# Patient Record
Sex: Female | Born: 1980 | Race: Black or African American | Hispanic: No | Marital: Single | Smoking: Never smoker
Health system: Southern US, Community
[De-identification: ages and names within clinical notes are randomized; demographics above are authoritative.]

---

## 2011-12-06 ENCOUNTER — Emergency Department: Payer: Self-pay | Admitting: Emergency Medicine

## 2015-09-13 ENCOUNTER — Ambulatory Visit: Payer: Self-pay

## 2015-09-20 ENCOUNTER — Ambulatory Visit: Payer: Self-pay

## 2015-09-25 ENCOUNTER — Ambulatory Visit: Payer: Self-pay

## 2016-05-02 ENCOUNTER — Emergency Department: Payer: Self-pay

## 2016-05-02 ENCOUNTER — Emergency Department
Admission: EM | Admit: 2016-05-02 | Discharge: 2016-05-02 | Disposition: A | Discharge status: Home / Self Care | Payer: Self-pay | Attending: Emergency Medicine | Admitting: Emergency Medicine

## 2016-05-02 DIAGNOSIS — Y990 Civilian activity done for income or pay: Secondary | ICD-10-CM | POA: Insufficient documentation

## 2016-05-02 DIAGNOSIS — Y93F2 Activity, caregiving, lifting: Secondary | ICD-10-CM | POA: Insufficient documentation

## 2016-05-02 DIAGNOSIS — Y9289 Other specified places as the place of occurrence of the external cause: Secondary | ICD-10-CM | POA: Insufficient documentation

## 2016-05-02 DIAGNOSIS — X500XXA Overexertion from strenuous movement or load, initial encounter: Secondary | ICD-10-CM | POA: Insufficient documentation

## 2016-05-02 DIAGNOSIS — R0789 Other chest pain: Secondary | ICD-10-CM | POA: Insufficient documentation

## 2016-05-02 LAB — TROPONIN I: Troponin I: <0.03 ng/mL (ref <0.03)

## 2016-05-02 LAB — BASIC METABOLIC PANEL
Anion gap: 7 (ref 5–15)
BUN: 9 mg/dL (ref 6–20)
CHLORIDE: 104 mmol/L (ref 101–111)
CO2: 27 mmol/L (ref 22–32)
CREATININE: 0.71 mg/dL (ref 0.44–1.00)
Calcium: 9.3 mg/dL (ref 8.9–10.3)
Glucose, Bld: 107 mg/dL — ABNORMAL HIGH (ref 65–99)
POTASSIUM: 3.8 mmol/L (ref 3.5–5.1)
SODIUM: 138 mmol/L (ref 135–145)

## 2016-05-02 LAB — CBC
HCT: 37.2 % (ref 35.0–47.0)
Hemoglobin: 12 g/dL (ref 12.0–16.0)
MCH: 25.3 pg — ABNORMAL LOW (ref 26.0–34.0)
MCHC: 32.2 g/dL (ref 32.0–36.0)
MCV: 78.4 fL — AB (ref 80.0–100.0)
PLATELETS: 404 10*3/uL (ref 150–440)
RBC: 4.74 MIL/uL (ref 3.80–5.20)
RDW: 15.6 % — AB (ref 11.5–14.5)
WBC: 10.6 10*3/uL (ref 3.6–11.0)

## 2016-05-02 MED ORDER — IBUPROFEN 600 MG PO TABS
ORAL_TABLET | ORAL | Status: AC
  Administered 2016-05-02: 600 mg via ORAL
  Filled 2016-05-02: qty 1

## 2016-05-02 MED ORDER — IBUPROFEN 400 MG PO TABS
600.0000 mg | ORAL_TABLET | Freq: Once | ORAL | Status: AC
  Administered 2016-05-02: 600 mg via ORAL

## 2016-05-02 NOTE — ED Triage Notes (Signed)
Pt c/o worsening CP since Wednesday night, central chest and aching. Mild SOB associated per patient report. Pt alert and oriented X4, active, cooperative, pt in NAD. RR even and unlabored, color WNL.  Pt talks in full and complete sentences. Denies NV, diaphoresis.

## 2016-05-02 NOTE — ED Notes (Signed)
Pt in via triage with complaints of intermittent chest pain since yesterday.  Pt denies any associating symptoms, states pain is worse while lying.  Pt denies any recent injury.  Pt A/Ox4, ambulatory to room, no immediate distress at this time.

## 2016-05-02 NOTE — ED Provider Notes (Signed)
[facility] Emergency Department Provider Note  ____________________________________________   I have reviewed the triage vital signs and the nursing notes.   HISTORY  Chief Complaint Chest Pain    HPI Claudia Wells is a 35 y.o. female who is healthy, denies personal or family history of PE, DVT, CAD at a young age, she has no history of diabetes recent surgery she does not take any birth control pills. 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 This is unusual activity for her. She states she picked up some heavy and felt a pain in her chest. The pain is better as she put the item down however, it has persisted uninterrupted since that time.It is not pleuritic she has no shortness of breath or nausea or vomiting. It is not exertional. It is only worse when she touches it or changes position. She doesn't think the exact spot in the right pectoralis muscle above the right breast where it hurts. She believes that she is pulled a muscle but she wants to "be sure".     History reviewed. No pertinent past medical history.  There are no active problems to display for this patient.   History reviewed. No pertinent surgical history.  Prior to Admission medications   Not on File    Allergies Patient has no known allergies.  Family History  Problem Relation Age of Onset  . Diabetes Mother     Social History Social History  Substance Use Topics  . Smoking status: Never Smoker  . Smokeless tobacco: Never Used  . Alcohol use No    Review of Systems Constitutional: No fever/chills Eyes: No visual changes. ENT: No sore throat. No stiff neck no neck pain Cardiovascular: Positive chest pain. Respiratory: Denies shortness of breath. Gastrointestinal:   no vomiting.  No diarrhea.  No constipation. Genitourinary: Negative for dysuria. Musculoskeletal: Negative lower  extremity swelling Skin: Negative for rash. Neurological: Negative for severe headaches, focal weakness or numbness. 10-point ROS otherwise negative.  ____________________________________________   PHYSICAL EXAM:  VITAL SIGNS: ED Triage Vitals  Enc Vitals Group     BP 05/02/16 1640 119/88     Pulse Rate 05/02/16 1640 75     Resp 05/02/16 1640 18     Temp 05/02/16 1640 97.5 F (36.4 C)     Temp Source 05/02/16 1640 Oral     SpO2 05/02/16 1640 100 %     Weight 05/02/16 1640 240 lb (108.9 kg)     Height 05/02/16 1640 5\' 1"  (1.549 m)     Head Circumference --      Peak Flow --      Pain Score 05/02/16 1638 6     Pain Loc --      Pain Edu? --      Excl. in GC? --     Constitutional: Alert and oriented. Well appearing and in no acute distress. Eyes: Conjunctivae are normal. PERRL. EOMI. Head: Atraumatic. Nose: No congestion/rhinnorhea. Mouth/Throat: Mucous membranes are moist.  Oropharynx non-erythematous. Neck: No stridor.   Nontender with no meningismus Chest: Tender to palpation in the right chest wall at the insertion of the pectoralis muscle. Pneumatosis area patient says "ouch that's the pain right there". Patient has no erythema no shingles lesion no swelling. Female chaperone in the room with us. There is no evidence of cellulitis. Pain is also worse with changing position or lifting  up or using her right arm against resistance. No flail chest or crepitus  Cardiovascular: Normal rate, regular rhythm. Grossly normal heart sounds.  Good peripheral circulation. Respiratory: Normal respiratory effort.  No retractions. Lungs CTAB. Abdominal: Soft and nontender. No distention. No guarding no rebound Back:  There is no focal tenderness or step off.  there is no midline tenderness there are no lesions noted. there is no CVA tenderness  Musculoskeletal: No lower extremity tenderness, no upper extremity tenderness. No joint effusions, no DVT signs strong distal pulses no  edema Neurologic:  Normal speech and language. No gross focal neurologic deficits are appreciated.  Skin:  Skin is warm, dry and intact. No rash noted. Psychiatric: Mood and affect are normal. Speech and behavior are normal.  ____________________________________________   LABS (all labs ordered are listed, but only abnormal results are displayed)  Labs Reviewed  BASIC METABOLIC PANEL - Abnormal; Notable for the following:       Result Value   Glucose, Bld 107 (*)    All other components within normal limits  CBC - Abnormal; Notable for the following:    MCV 78.4 (*)    MCH 25.3 (*)    RDW 15.6 (*)    All other components within normal limits  TROPONIN I   ____________________________________________  EKG  I personally interpreted any EKGs ordered by me or triage Normal sinus rhythm at 73 beats minute no acute ST elevation or acute ST depression nonspecific ST changes, there is some baseline artifact noted. ____________________________________________  RADIOLOGY  I reviewed any imaging ordered by me or triage that were performed during my shift and, if possible, patient and/or family made aware of any abnormal findings. ____________________________________________   PROCEDURES  Procedure(s) performed: None  Procedures  Critical Care performed: None  ____________________________________________   INITIAL IMPRESSION / ASSESSMENT AND PLAN / ED COURSE  Pertinent labs & imaging results that were available during my care of the patient were reviewed by me and considered in my medical decision making (see chart for details).  Patient with very reproducible chest wall pain after lifting up something heavy. Obvious inciting injury. Troponin is negative despite chest discomfort for 3 days. EKG is reassuring. She is perked negative.  At this time, there does not appear to be clinical evidence to support the diagnosis of pulmonary embolus, dissection, myocarditis,  endocarditis, pericarditis, pericardial tamponade, acute coronary syndrome, pneumothorax, pneumonia, or any other acute intrathoracic pathology that will require admission or acute intervention. Nor is there evidence of any significant intra-abdominal pathology causing this discomfort.  Return precautions and follow-up given and understood.  Clinical Course    ____________________________________________   FINAL CLINICAL IMPRESSION(S) / ED DIAGNOSES  Final diagnoses:  None      This chart was dictated using voice recognition software.  Despite best efforts to proofread,  errors can occur which can change meaning.      Jeanmarie PlantJames A McShane, MD 05/02/16 [phone]

## 2017-08-23 ENCOUNTER — Encounter: Payer: Self-pay | Admitting: Medical Oncology

## 2017-08-23 ENCOUNTER — Emergency Department: Payer: Self-pay

## 2017-08-23 ENCOUNTER — Emergency Department
Admission: EM | Admit: 2017-08-23 | Discharge: 2017-08-23 | Disposition: A | Discharge status: Home / Self Care | Payer: Self-pay | Attending: Student in an Organized Health Care Education/Training Program | Admitting: Student in an Organized Health Care Education/Training Program

## 2017-08-23 DIAGNOSIS — R079 Chest pain, unspecified: Secondary | ICD-10-CM | POA: Insufficient documentation

## 2017-08-23 LAB — BASIC METABOLIC PANEL
Anion gap: 11 (ref 5–15)
BUN: 10 mg/dL (ref 6–20)
CHLORIDE: 102 mmol/L (ref 101–111)
CO2: 25 mmol/L (ref 22–32)
CREATININE: 0.71 mg/dL (ref 0.44–1.00)
Calcium: 9.5 mg/dL (ref 8.9–10.3)
GFR calc Af Amer: >60 mL/min (ref >60)
GFR calc non Af Amer: >60 mL/min (ref >60)
GLUCOSE: 103 mg/dL — AB (ref 65–99)
POTASSIUM: 3.9 mmol/L (ref 3.5–5.1)
Sodium: 138 mmol/L (ref 135–145)

## 2017-08-23 LAB — CBC
HEMATOCRIT: 38.4 % (ref 35.0–47.0)
Hemoglobin: 12.3 g/dL (ref 12.0–16.0)
MCH: 24.5 pg — ABNORMAL LOW (ref 26.0–34.0)
MCHC: 32 g/dL (ref 32.0–36.0)
MCV: 76.7 fL — AB (ref 80.0–100.0)
PLATELETS: 401 10*3/uL (ref 150–440)
RBC: 5.01 MIL/uL (ref 3.80–5.20)
RDW: 15.1 % — ABNORMAL HIGH (ref 11.5–14.5)
WBC: 13.8 10*3/uL — ABNORMAL HIGH (ref 3.6–11.0)

## 2017-08-23 LAB — TROPONIN I: Troponin I: <0.03 ng/mL (ref <0.03)

## 2017-08-23 MED ORDER — ALBUTEROL SULFATE HFA 108 (90 BASE) MCG/ACT IN AERS: 2.0000 | INHALATION_SPRAY | Freq: Four times a day (QID) | RESPIRATORY_TRACT | 2 refills | Status: AC | PRN

## 2017-08-23 MED ORDER — HYDROCODONE-ACETAMINOPHEN 5-325 MG PO TABS
1.0000 | ORAL_TABLET | Freq: Once | ORAL | Status: AC
  Administered 2017-08-23: 1 via ORAL
  Filled 2017-08-23: qty 1

## 2017-08-23 NOTE — ED Triage Notes (Signed)
Pt reports she began having central chest tightness Monday. Pt reports sob with pain. Pt in NAD at this time. Speaking in complete sentences without difficulty.

## 2017-08-23 NOTE — ED Provider Notes (Signed)
[facility] Emergency Department Provider Note    First MD Initiated Contact with Patient 08/23/17 1008     (approximate)  I have reviewed the triage vital signs and the nursing notes.   HISTORY  Chief Complaint Chest Pain and Shortness of Breath    HPI Claudia Wells is a 37 y.o. female resents with 1 week of midsternal chest pain that is worse with movement.  Does have some shortness of breath.  States it is worse when laying flat.  Denies any measured fevers.  Has had similar episodes of pain in the past and was told it was muscular skeletal pain.  Denies any personal history of heart disease.  No history of elevated blood pressure, high cholesterol or diabetes.  She does not smoke.  She is not on any birth control.  No recent prolonged travel.  States the pain is primarily on the right side of her chest.  No associated nausea vomiting or diaphoresis.  She states that it is mild in severity.   History reviewed. No pertinent past medical history. Family History  Problem Relation Age of Onset  . Diabetes Mother    History reviewed. No pertinent surgical history. There are no active problems to display for this patient.     Prior to Admission medications   Not on File    Allergies Patient has no known allergies.    Social History Social History   Tobacco Use  . Smoking status: Never Smoker  . Smokeless tobacco: Never Used  Substance Use Topics  . Alcohol use: No  . Drug use: No    Review of Systems Patient denies headaches, rhinorrhea, blurry vision, numbness, shortness of breath, chest pain, edema, cough, abdominal pain, nausea, vomiting, diarrhea, dysuria, fevers, rashes or hallucinations unless otherwise stated above in HPI. ____________________________________________   PHYSICAL EXAM:  VITAL SIGNS: Vitals:   08/23/17 0847  BP: 131/69  Pulse: 73  Resp: 16  Temp: (!) 97.5 F (36.4 C)  SpO2: 100%    Constitutional:  Alert and oriented. Well appearing and in no acute distress. Eyes: Conjunctivae are normal.  Head: Atraumatic. Nose: No congestion/rhinnorhea. Mouth/Throat: Mucous membranes are moist.   Neck: No stridor. Painless ROM.  Cardiovascular: Normal rate, regular rhythm. Grossly normal heart sounds.  Good peripheral circulation. Respiratory: Normal respiratory effort.  No retractions. Lungs CTAB. Gastrointestinal: Soft and nontender. No distention. No abdominal bruits. No CVA tenderness. Genitourinary:  Musculoskeletal:  Reproducible pain with palpation of right chest wall. No lower extremity tenderness nor edema.  No joint effusions. Neurologic:  Normal speech and language. No gross focal neurologic deficits are appreciated. No facial droop Skin:  Skin is warm, dry and intact. No rash noted. Psychiatric: Mood and affect are normal. Speech and behavior are normal.  ____________________________________________   LABS (all labs ordered are listed, but only abnormal results are displayed)  Results for orders placed or performed during the hospital encounter of 08/23/17 (from the past 24 hour(s))  Basic metabolic panel     Status: Abnormal   Collection Time: 08/23/17  8:49 AM  Result Value Ref Range   Sodium 138 135 - 145 mmol/L   Potassium 3.9 3.5 - 5.1 mmol/L   Chloride 102 101 - 111 mmol/L   CO2 25 22 - 32 mmol/L   Glucose, Bld 103 (H) 65 - 99 mg/dL   BUN 10 6 - 20 mg/dL   Creatinine, Ser 1.61 0.44 - 1.00 mg/dL   Calcium 9.5 8.9 - 10.3  mg/dL   GFR calc non Af Amer >60 >60 mL/min   GFR calc Af Amer >60 >60 mL/min   Anion gap 11 5 - 15  CBC     Status: Abnormal   Collection Time: 08/23/17  8:49 AM  Result Value Ref Range   WBC 13.8 (H) 3.6 - 11.0 K/uL   RBC 5.01 3.80 - 5.20 MIL/uL   Hemoglobin 12.3 12.0 - 16.0 g/dL   HCT 62.938.4 52.835.0 - 41.347.0 %   MCV 76.7 (L) 80.0 - 100.0 fL   MCH 24.5 (L) 26.0 - 34.0 pg   MCHC 32.0 32.0 - 36.0 g/dL   RDW 24.415.1 (H) 01.011.5 - 27.214.5 %   Platelets 401 150 -  440 K/uL  Troponin I     Status: None   Collection Time: 08/23/17  8:49 AM  Result Value Ref Range   Troponin I <0.03 <0.03 ng/mL   ____________________________________________  EKG My review and personal interpretation at Time: 8:46   Indication: chest pain  Rate: 80  Rhythm: sinus Axis: normal Other: non specific st abn, no stemi, ____________________________________________  RADIOLOGY  I personally reviewed all radiographic images ordered to evaluate for the above acute complaints and reviewed radiology reports and findings.  These findings were personally discussed with the patient.  Please see medical record for radiology report.  ____________________________________________   PROCEDURES  Procedure(s) performed:  Procedures    Critical Care performed: no ____________________________________________   INITIAL IMPRESSION / ASSESSMENT AND PLAN / ED COURSE  Pertinent labs & imaging results that were available during my care of the patient were reviewed by me and considered in my medical decision making (see chart for details).  DDX: ACS, pericarditis, esophagitis, boerhaaves, pe, dissection, pna, bronchitis, costochondritis   Claudia Wells is a 37 y.o. who presents to the ED with chest pain as described above.  Patient low risk heart score of 2.  Troponin negative after several days of pain.  Seems muscular skeletal in nature.  She is low risk by Wells criteria and is PERC negative.  No evidence of pneumonia, pneumothorax.  Possible component of bronchitis for which she will receive a albuterol inhaler prescription as well as pain medication and referral to cardiology.      As part of my medical decision making, I reviewed the following data within the electronic MEDICAL RECORD NUMBER Nursing notes reviewed and incorporated, Labs reviewed, notes from prior ED visits.   ____________________________________________   FINAL CLINICAL IMPRESSION(S) / ED  DIAGNOSES  Final diagnoses:  Chest pain, unspecified type      NEW MEDICATIONS STARTED DURING THIS VISIT:  New Prescriptions   No medications on file     Note:  This document was prepared using Dragon voice recognition software and may include unintentional dictation errors.    Willy Eddyobinson, Vencil Basnett, MD 08/23/17 1126

## 2017-08-23 NOTE — ED Notes (Signed)
Pt is here today for complaints of tightness in the chest. Pt has had this since Monday. Pt is NAD and A/ox4

## 2018-03-12 IMAGING — CR DG CHEST 2V
1 series · 2 of 2 positions shown · non-contrast
Comparison: None.

CLINICAL DATA: Worsening chest pain since [REDACTED] night

EXAM:
CHEST  2 VIEW

[Series 1: dg chest 2 view · 0.14mm/px · 2 of 2 slices shown]
[im 1/2]
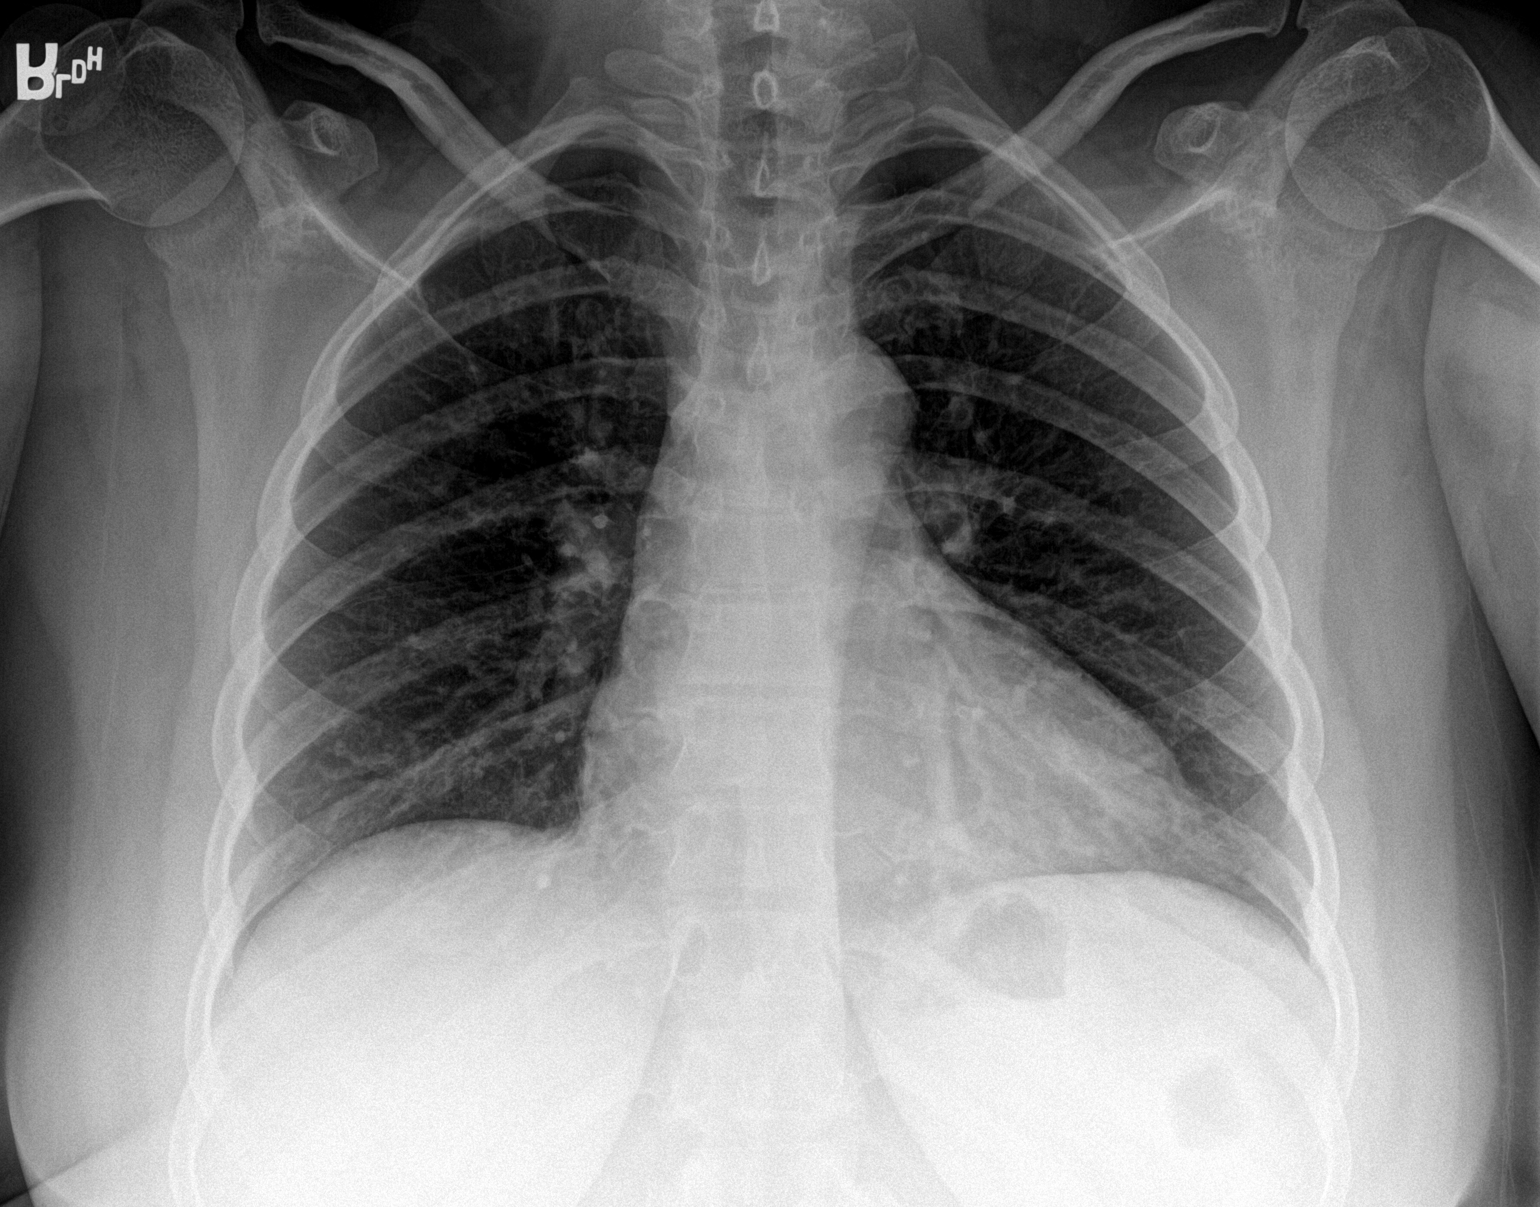
[im 2/2]
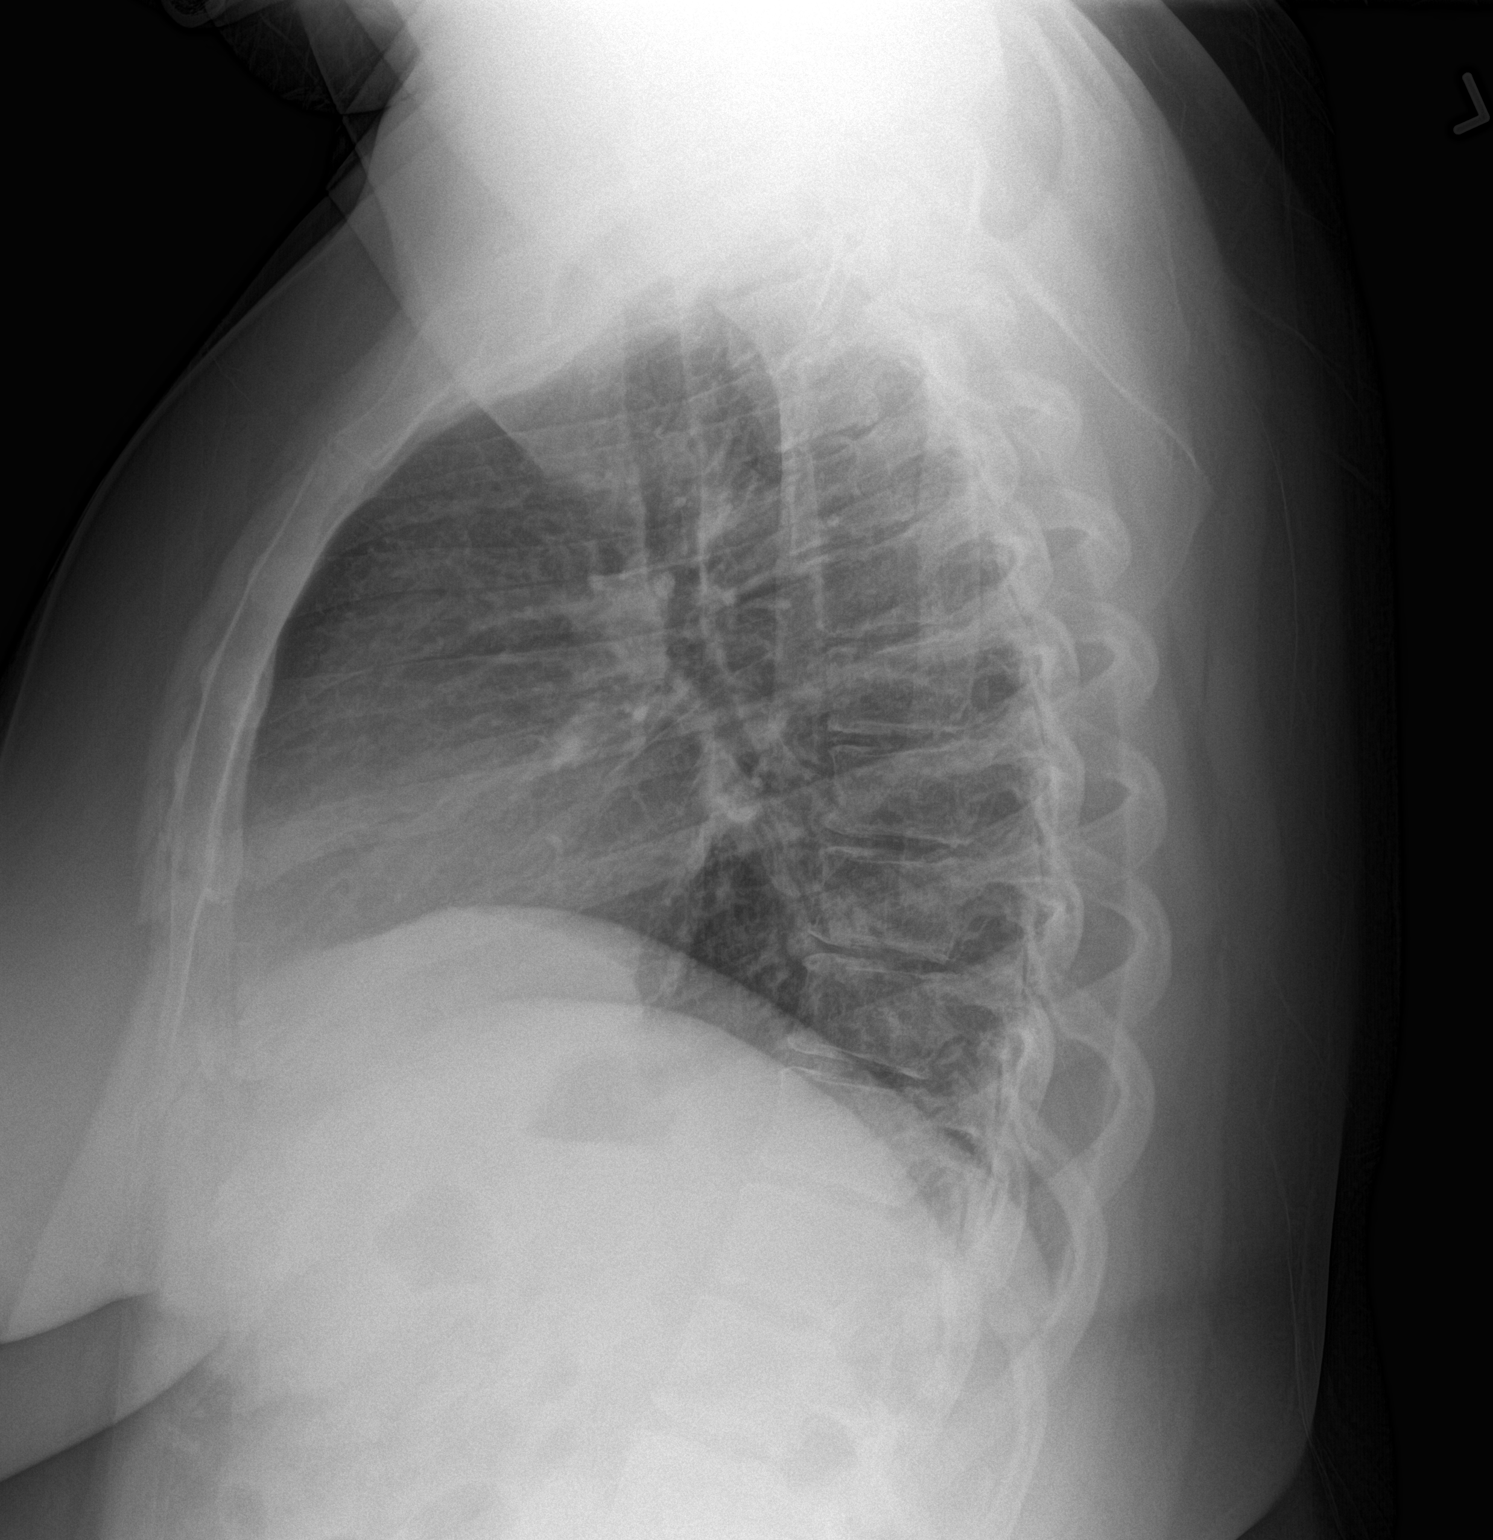

[2 of 2 positions shown; findings below may reference images not displayed]

FINDINGS: The heart size and mediastinal contours are within normal limits.
Both lungs are clear. The visualized skeletal structures are
unremarkable.
IMPRESSION: No active cardiopulmonary disease.

## 2019-07-03 IMAGING — CR DG CHEST 2V
2 series · 2 of 2 positions shown · non-contrast
Comparison: May 02, 2016

CLINICAL DATA: Right-sided chest pain and shortness of breath.

EXAM:
CHEST - 2 VIEW

[chest pa]
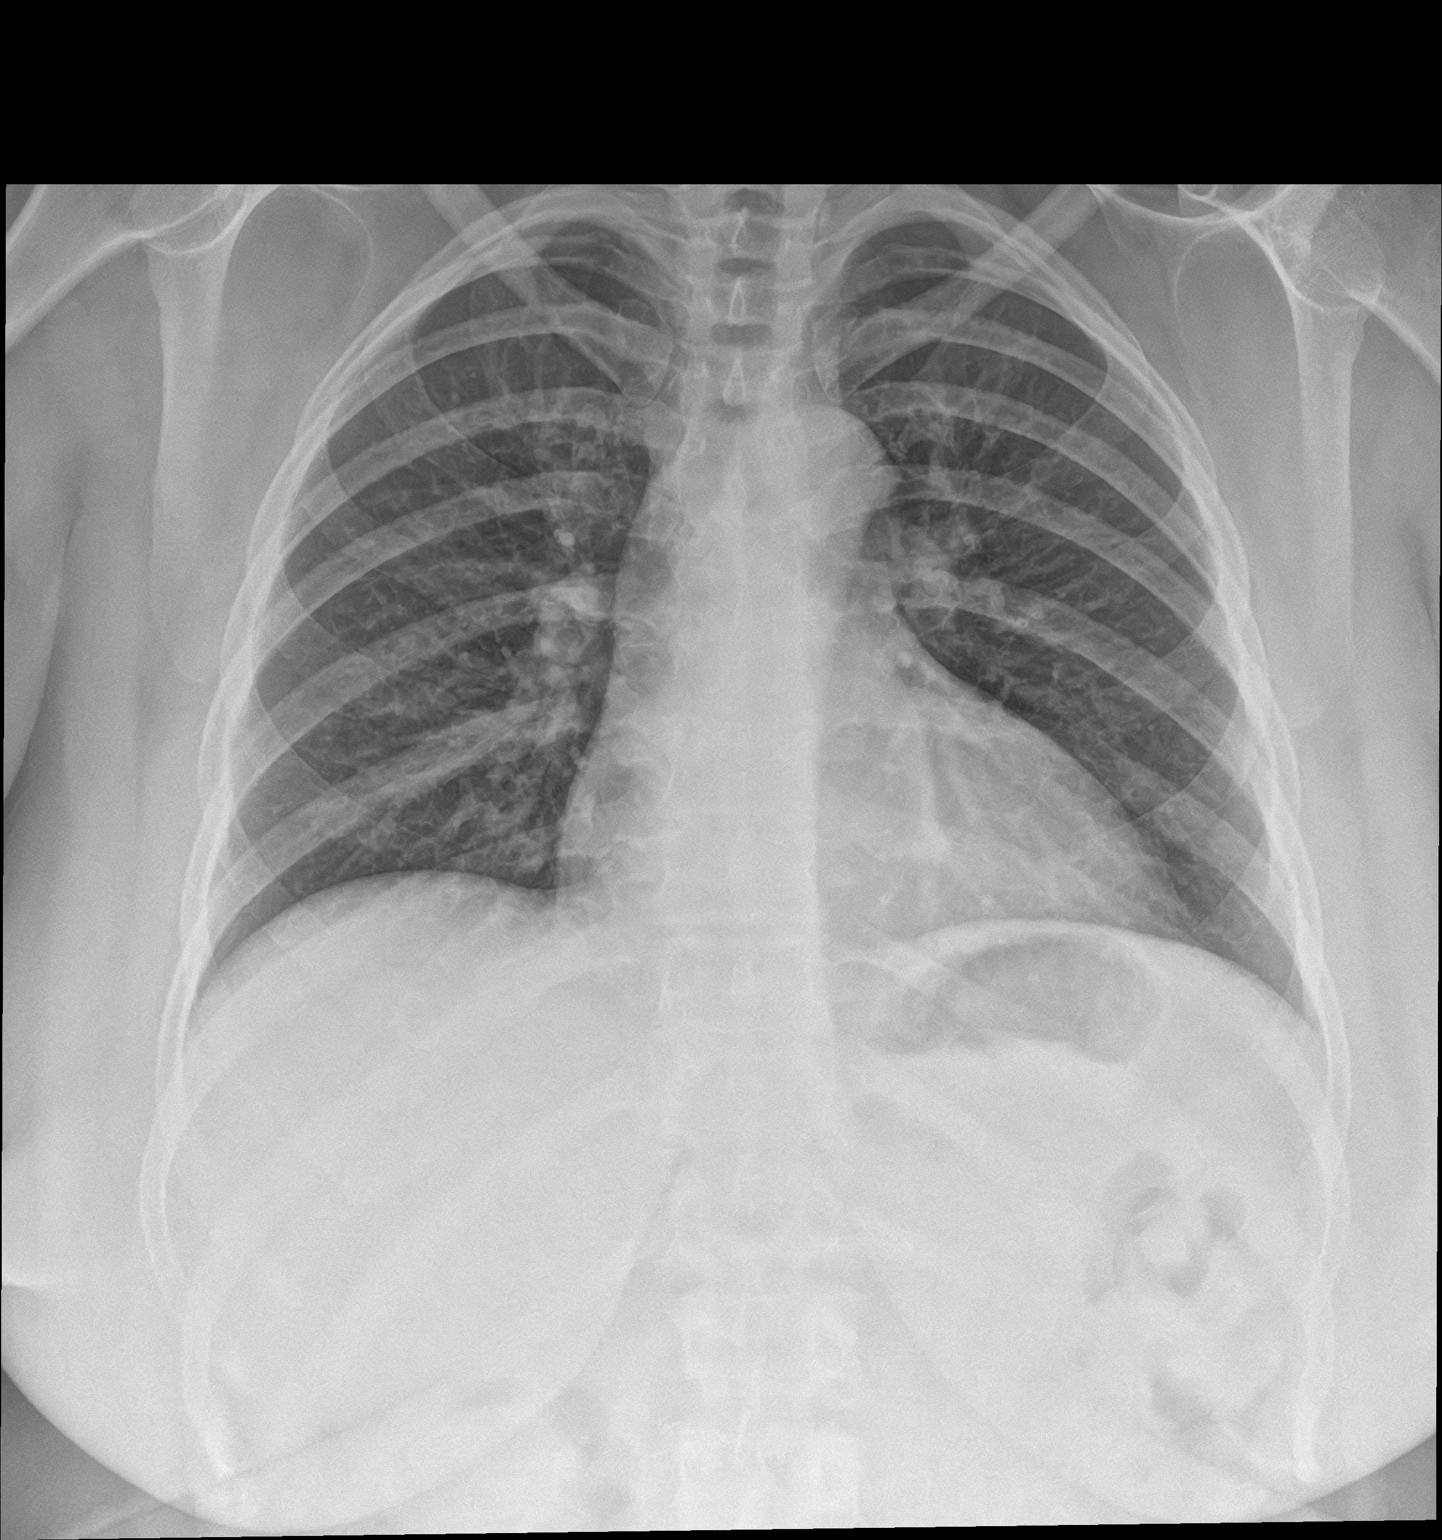

[chest lat]
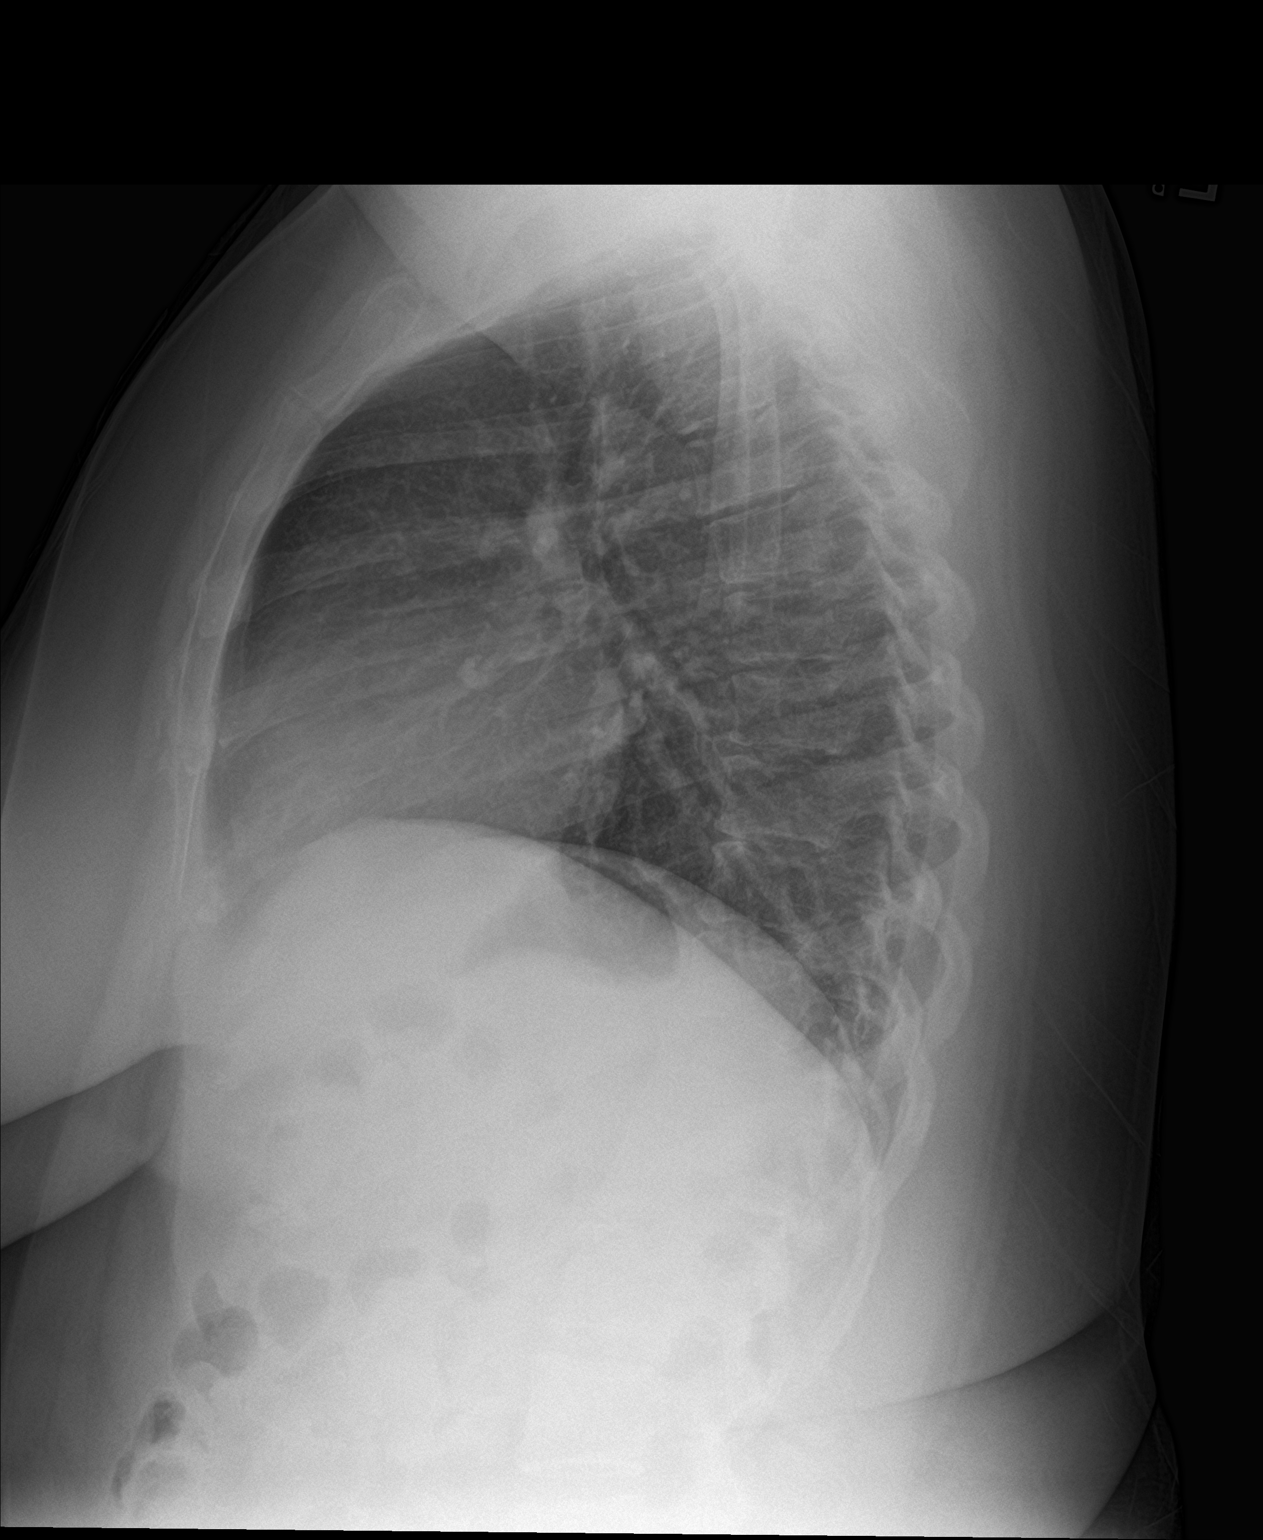

[2 of 2 positions shown; findings below may reference images not displayed]

FINDINGS: The heart size and mediastinal contours are within normal limits.
Both lungs are clear. The visualized skeletal structures are
unremarkable.
IMPRESSION: No active cardiopulmonary disease.

## 2020-09-21 ENCOUNTER — Ambulatory Visit (LOCAL_COMMUNITY_HEALTH_CENTER): Payer: Self-pay

## 2020-09-21 ENCOUNTER — Other Ambulatory Visit: Payer: Self-pay

## 2020-09-21 DIAGNOSIS — Z111 Encounter for screening for respiratory tuberculosis: Secondary | ICD-10-CM

## 2020-09-24 ENCOUNTER — Ambulatory Visit (LOCAL_COMMUNITY_HEALTH_CENTER): Payer: Medicaid Other

## 2020-09-24 ENCOUNTER — Other Ambulatory Visit: Payer: Self-pay

## 2020-09-24 DIAGNOSIS — Z111 Encounter for screening for respiratory tuberculosis: Secondary | ICD-10-CM

## 2020-09-24 LAB — TB SKIN TEST
Induration: 0 mm
TB Skin Test: NEGATIVE
